# Patient Record
Sex: Female | Born: 1965 | Race: White | Hispanic: No | Marital: Married | Smoking: Never smoker
Health system: Southern US, Community
[De-identification: ages and names within clinical notes are randomized; demographics above are authoritative.]

---

## 2005-03-31 ENCOUNTER — Encounter: Admission: RE | Admit: 2005-03-31 | Discharge: 2005-03-31 | Payer: Self-pay | Admitting: Obstetrics and Gynecology

## 2010-06-19 ENCOUNTER — Other Ambulatory Visit: Payer: Self-pay | Admitting: Family Medicine

## 2010-06-19 DIAGNOSIS — Z1231 Encounter for screening mammogram for malignant neoplasm of breast: Secondary | ICD-10-CM

## 2010-07-05 ENCOUNTER — Ambulatory Visit: Payer: Self-pay

## 2010-07-08 ENCOUNTER — Ambulatory Visit
Admission: RE | Admit: 2010-07-08 | Discharge: 2010-07-08 | Disposition: A | Discharge status: Home / Self Care | Payer: Commercial Indemnity | Source: Ambulatory Visit | Attending: Family Medicine | Admitting: Family Medicine

## 2010-07-08 DIAGNOSIS — Z1231 Encounter for screening mammogram for malignant neoplasm of breast: Secondary | ICD-10-CM

## 2011-12-22 ENCOUNTER — Other Ambulatory Visit: Payer: Self-pay | Admitting: Family Medicine

## 2011-12-22 DIAGNOSIS — Z1231 Encounter for screening mammogram for malignant neoplasm of breast: Secondary | ICD-10-CM

## 2012-01-29 ENCOUNTER — Ambulatory Visit
Admission: RE | Admit: 2012-01-29 | Discharge: 2012-01-29 | Disposition: A | Discharge status: Home / Self Care | Payer: Managed Care, Other (non HMO) | Source: Ambulatory Visit | Attending: Family Medicine | Admitting: Family Medicine

## 2012-01-29 DIAGNOSIS — Z1231 Encounter for screening mammogram for malignant neoplasm of breast: Secondary | ICD-10-CM

## 2013-01-11 ENCOUNTER — Other Ambulatory Visit: Payer: Self-pay

## 2013-01-11 DIAGNOSIS — Z1231 Encounter for screening mammogram for malignant neoplasm of breast: Secondary | ICD-10-CM

## 2013-02-11 ENCOUNTER — Ambulatory Visit
Admission: RE | Admit: 2013-02-11 | Discharge: 2013-02-11 | Disposition: A | Discharge status: Home / Self Care | Payer: Managed Care, Other (non HMO) | Source: Ambulatory Visit

## 2013-02-11 DIAGNOSIS — Z1231 Encounter for screening mammogram for malignant neoplasm of breast: Secondary | ICD-10-CM

## 2013-03-19 ENCOUNTER — Emergency Department (HOSPITAL_BASED_OUTPATIENT_CLINIC_OR_DEPARTMENT_OTHER)
Admission: EM | Admit: 2013-03-19 | Discharge: 2013-03-19 | Disposition: A | Discharge status: Home / Self Care | Payer: Managed Care, Other (non HMO) | Attending: Emergency Medicine | Admitting: Emergency Medicine

## 2013-03-19 ENCOUNTER — Encounter (HOSPITAL_BASED_OUTPATIENT_CLINIC_OR_DEPARTMENT_OTHER): Payer: Self-pay | Admitting: Emergency Medicine

## 2013-03-19 ENCOUNTER — Emergency Department (HOSPITAL_BASED_OUTPATIENT_CLINIC_OR_DEPARTMENT_OTHER): Payer: Managed Care, Other (non HMO)

## 2013-03-19 DIAGNOSIS — W010XXA Fall on same level from slipping, tripping and stumbling without subsequent striking against object, initial encounter: Secondary | ICD-10-CM | POA: Insufficient documentation

## 2013-03-19 DIAGNOSIS — S60219A Contusion of unspecified wrist, initial encounter: Secondary | ICD-10-CM | POA: Insufficient documentation

## 2013-03-19 DIAGNOSIS — Y9373 Activity, racquet and hand sports: Secondary | ICD-10-CM | POA: Insufficient documentation

## 2013-03-19 DIAGNOSIS — Y9239 Other specified sports and athletic area as the place of occurrence of the external cause: Secondary | ICD-10-CM | POA: Insufficient documentation

## 2013-03-19 DIAGNOSIS — Y92838 Other recreation area as the place of occurrence of the external cause: Secondary | ICD-10-CM

## 2013-03-19 DIAGNOSIS — S63509A Unspecified sprain of unspecified wrist, initial encounter: Secondary | ICD-10-CM

## 2013-03-19 MED ORDER — IBUPROFEN 800 MG PO TABS
800.0000 mg | ORAL_TABLET | Freq: Once | ORAL | Status: AC
  Administered 2013-03-19: 800 mg via ORAL
  Filled 2013-03-19: qty 1

## 2013-03-19 NOTE — ED Notes (Signed)
Patient transported to X-ray 

## 2013-03-19 NOTE — Discharge Instructions (Signed)
Wrist Pain Wrist injuries are frequent in adults and children. A sprain is an injury to the ligaments that hold your bones together. A strain is an injury to muscle or muscle cord-like structures (tendons) from stretching or pulling. Generally, when wrists are moderately tender to touch following a fall or injury, a break in the bone (fracture) may be present. Most wrist sprains or strains are better in 3 to 5 days, but complete healing may take several weeks. HOME CARE INSTRUCTIONS   Put ice on the injured area.  Put ice in a plastic bag.  Place a towel between your skin and the bag.  Leave the ice on for 15-20 minutes, 03-04 times a day, for the first 2 days.  Keep your arm raised above the level of your heart whenever possible to reduce swelling and pain.  Rest the injured area for at least 48 hours or as directed by your caregiver.  If a splint or elastic bandage has been applied, use it for as long as directed by your caregiver or until seen by a caregiver for a follow-up exam.  Only take over-the-counter or prescription medicines for pain, discomfort, or fever as directed by your caregiver.  Keep all follow-up appointments. You may need to follow up with a specialist or have follow-up X-rays. Improvement in pain level is not a guarantee that you did not fracture a bone in your wrist. The only way to determine whether or not you have a broken bone is by X-ray. SEEK IMMEDIATE MEDICAL CARE IF:   Your fingers are swollen, very red, white, or cold and blue.  Your fingers are numb or tingling.  You have increasing pain.  You have difficulty moving your fingers. MAKE SURE YOU:   Understand these instructions.  Will watch your condition.  Will get help right away if you are not doing well or get worse. Document Released: 10/23/2004 Document Revised: 04/07/2011 Document Reviewed: 03/06/2010 [employer] Patient Information 2014 [employer].  

## 2013-03-19 NOTE — ED Provider Notes (Signed)
CSN: [id number]     Arrival date & time 03/19/13  2033 History  This chart was scribed for Susan BuccoMelanie Courtenay Hirth, MD by Dorothey Basemania Sutton, ED Scribe. This patient was seen in room MH01/MH01 and the patient's care was started at 9:38 PM.    Chief Complaint  Patient presents with  . Fall   The history is provided by the patient. No language interpreter was used.   HPI Comments: Susan Haynes is a 48 y.o. female who presents to the Emergency Department complaining of a fall that occurred about 8.5 hours ago when she reports that she tripped while playing tennis and landed on her left arm (FOOSH mechanism). Patient is complaining of a constant pain with associated swelling and ecchymosis to the left wrist that has been progressively worsening since the incident. She denies taking any medications at home to treat her symptoms. She denies numbness to the area or any other injuries. Patient is right-handed. She states her last PO intake was about 3.5 hours ago. Patient has no other pertinent medical history.   History reviewed. No pertinent past medical history. History reviewed. No pertinent past surgical history. No family history on file. History  Substance Use Topics  . Smoking status: Never Smoker   . Smokeless tobacco: Not on file  . Alcohol Use: Yes   OB History   Grav Para Term Preterm Abortions TAB SAB Ect Mult Living                 Review of Systems  Constitutional: Negative for fever.  Gastrointestinal: Negative for nausea and vomiting.  Musculoskeletal: Positive for arthralgias (left wrist) and joint swelling. Negative for back pain and neck pain.  Skin: Positive for color change (ecchymosis). Negative for wound.  Neurological: Negative for weakness, numbness and headaches.   Allergies  Review of patient's allergies indicates no known allergies.  Home Medications  No current outpatient prescriptions on file.  Triage Vitals: BP 107/70  Pulse 82  Temp(Src) 98.6 F (37 C) (Oral)   Resp 16  Ht 5\' 3"  (1.6 m)  Wt 154 lb (69.854 kg)  BMI 27.29 kg/m2  SpO2 100%  Physical Exam  Constitutional: She is oriented to person, place, and time. She appears well-developed and well-nourished.  HENT:  Head: Normocephalic and atraumatic.  Eyes: Pupils are equal, round, and reactive to light.  Neck: Normal range of motion. Neck supple.  Cardiovascular: Intact distal pulses.   Pulmonary/Chest: Effort normal. No respiratory distress.  Abdominal: Soft.  Musculoskeletal: Normal range of motion. She exhibits no edema.  No tenderness to the elbow or hand.   Tenderness on the radial aspect of the left wrist with ecchymosis and swelling. No obvious deformity. No wounds. Normal sensation and motor function.   Lymphadenopathy:    She has no cervical adenopathy.  Neurological: She is alert and oriented to person, place, and time.  Skin: Skin is warm and dry. No rash noted.  Psychiatric: She has a normal mood and affect.    ED Course  Procedures (including critical care time)  DIAGNOSTIC STUDIES: Oxygen Saturation is 100% on room air, normal by my interpretation.    COORDINATION OF CARE: 9:40 PM- Will order an x-ray of the left wrist. Discussed treatment plan with patient at bedside and patient verbalized agreement.     Labs Review Labs Reviewed - No data to display  Imaging Review Dg Wrist Complete Left  03/19/2013   CLINICAL DATA:  Fall, left wrist pain and swelling.  EXAM: LEFT  WRIST - COMPLETE 3+ VIEW  COMPARISON:  None available for comparison at time of study interpretation.  FINDINGS: No acute fracture deformity or dislocation. Joint space intact without erosions. No destructive bony lesions. Mild dorsal wrist soft tissue swelling without subcutaneous gas or radiopaque foreign bodies.  IMPRESSION: Mild soft tissue swelling without fracture deformity nor dislocation.   Electronically Signed   By: Awilda Metro   On: 03/19/2013 22:24    EKG Interpretation   None        MDM   Final diagnoses:  Wrist sprain    No fractures identified. Patient does not have specific tenderness of the scaphoid. However given her amount tenderness I did go ahead and put her in a thumb spica splint. She was advised in ice and elevation. She was encouraged to use ibuprofen for the pain and swelling. I advised her to leave the wrist in the splint for the next few days if it's still significantly tender after that she needs a followup for reevaluation with Dr. Pearletha Forge.  I personally performed the services described in this documentation, which was scribed in my presence.  The recorded information has been reviewed and considered.     Susan Bucco, MD 03/19/13 2241

## 2013-03-19 NOTE — ED Notes (Signed)
Playing tennis today, fell, and caught herself with her left arm.  C/o left wrist pain, swelling.  Denies striking head or LOC.

## 2013-03-19 NOTE — ED Notes (Signed)
Pt states she was playing tennis and she fell, landing on her left wrist. Pt has swelling noted to the left wrist. Pt has wrist elevated and pain decreased with elevation.

## 2014-02-03 ENCOUNTER — Other Ambulatory Visit: Payer: Self-pay

## 2014-02-03 DIAGNOSIS — Z1231 Encounter for screening mammogram for malignant neoplasm of breast: Secondary | ICD-10-CM

## 2014-02-21 ENCOUNTER — Ambulatory Visit: Payer: Managed Care, Other (non HMO)

## 2014-03-21 ENCOUNTER — Encounter (INDEPENDENT_AMBULATORY_CARE_PROVIDER_SITE_OTHER): Payer: Self-pay

## 2014-03-21 ENCOUNTER — Ambulatory Visit
Admission: RE | Admit: 2014-03-21 | Discharge: 2014-03-21 | Disposition: A | Discharge status: Home / Self Care | Payer: Managed Care, Other (non HMO) | Source: Ambulatory Visit

## 2014-03-21 DIAGNOSIS — Z1231 Encounter for screening mammogram for malignant neoplasm of breast: Secondary | ICD-10-CM

## 2015-02-20 ENCOUNTER — Other Ambulatory Visit: Payer: Self-pay

## 2015-02-20 DIAGNOSIS — Z1231 Encounter for screening mammogram for malignant neoplasm of breast: Secondary | ICD-10-CM

## 2015-03-26 ENCOUNTER — Ambulatory Visit
Admission: RE | Admit: 2015-03-26 | Discharge: 2015-03-26 | Disposition: A | Discharge status: Home / Self Care | Payer: Managed Care, Other (non HMO) | Source: Ambulatory Visit

## 2015-03-26 DIAGNOSIS — Z1231 Encounter for screening mammogram for malignant neoplasm of breast: Secondary | ICD-10-CM

## 2015-03-29 ENCOUNTER — Other Ambulatory Visit: Payer: Self-pay | Admitting: Family Medicine

## 2015-03-29 DIAGNOSIS — S82892A Other fracture of left lower leg, initial encounter for closed fracture: Secondary | ICD-10-CM

## 2015-03-30 ENCOUNTER — Other Ambulatory Visit: Payer: Self-pay | Admitting: Family Medicine

## 2015-03-30 DIAGNOSIS — N644 Mastodynia: Secondary | ICD-10-CM

## 2015-04-03 ENCOUNTER — Ambulatory Visit
Admission: RE | Admit: 2015-04-03 | Discharge: 2015-04-03 | Disposition: A | Discharge status: Home / Self Care | Payer: Managed Care, Other (non HMO) | Source: Ambulatory Visit | Attending: Family Medicine | Admitting: Family Medicine

## 2015-04-03 DIAGNOSIS — N644 Mastodynia: Secondary | ICD-10-CM

## 2015-05-23 ENCOUNTER — Other Ambulatory Visit: Payer: Managed Care, Other (non HMO)

## 2015-06-08 ENCOUNTER — Other Ambulatory Visit: Payer: Managed Care, Other (non HMO)

## 2016-05-08 ENCOUNTER — Other Ambulatory Visit: Payer: Self-pay | Admitting: Family Medicine

## 2016-05-08 DIAGNOSIS — Z1231 Encounter for screening mammogram for malignant neoplasm of breast: Secondary | ICD-10-CM

## 2016-05-28 ENCOUNTER — Ambulatory Visit
Admission: RE | Admit: 2016-05-28 | Discharge: 2016-05-28 | Disposition: A | Discharge status: Home / Self Care | Payer: Managed Care, Other (non HMO) | Source: Ambulatory Visit | Attending: Family Medicine | Admitting: Family Medicine

## 2016-05-28 DIAGNOSIS — Z1231 Encounter for screening mammogram for malignant neoplasm of breast: Secondary | ICD-10-CM

## 2017-05-28 ENCOUNTER — Other Ambulatory Visit: Payer: Self-pay | Admitting: Family Medicine

## 2017-05-28 DIAGNOSIS — Z1231 Encounter for screening mammogram for malignant neoplasm of breast: Secondary | ICD-10-CM

## 2017-06-24 ENCOUNTER — Ambulatory Visit: Payer: Managed Care, Other (non HMO)

## 2017-09-24 ENCOUNTER — Ambulatory Visit
Admission: RE | Admit: 2017-09-24 | Discharge: 2017-09-24 | Disposition: A | Discharge status: Home / Self Care | Payer: Managed Care, Other (non HMO) | Source: Ambulatory Visit | Attending: Family Medicine | Admitting: Family Medicine

## 2017-09-24 DIAGNOSIS — Z1231 Encounter for screening mammogram for malignant neoplasm of breast: Secondary | ICD-10-CM

## 2017-12-04 ENCOUNTER — Other Ambulatory Visit: Payer: Self-pay | Admitting: Family Medicine

## 2017-12-04 ENCOUNTER — Ambulatory Visit
Admission: RE | Admit: 2017-12-04 | Discharge: 2017-12-04 | Disposition: A | Discharge status: Home / Self Care | Payer: Managed Care, Other (non HMO) | Source: Ambulatory Visit | Attending: Family Medicine | Admitting: Family Medicine

## 2017-12-04 DIAGNOSIS — R52 Pain, unspecified: Secondary | ICD-10-CM

## 2018-12-02 ENCOUNTER — Other Ambulatory Visit: Payer: Self-pay | Admitting: Family Medicine

## 2018-12-02 DIAGNOSIS — Z1231 Encounter for screening mammogram for malignant neoplasm of breast: Secondary | ICD-10-CM

## 2019-02-01 ENCOUNTER — Ambulatory Visit
Admission: RE | Admit: 2019-02-01 | Discharge: 2019-02-01 | Disposition: A | Discharge status: Home / Self Care | Payer: Managed Care, Other (non HMO) | Source: Ambulatory Visit | Attending: Family Medicine | Admitting: Family Medicine

## 2019-02-01 ENCOUNTER — Other Ambulatory Visit: Payer: Self-pay

## 2019-02-01 DIAGNOSIS — Z1231 Encounter for screening mammogram for malignant neoplasm of breast: Secondary | ICD-10-CM

## 2019-08-11 ENCOUNTER — Other Ambulatory Visit: Payer: Self-pay | Admitting: Family Medicine

## 2019-08-11 DIAGNOSIS — M7660 Achilles tendinitis, unspecified leg: Secondary | ICD-10-CM

## 2019-08-12 ENCOUNTER — Other Ambulatory Visit: Payer: Self-pay

## 2019-08-12 ENCOUNTER — Ambulatory Visit
Admission: RE | Admit: 2019-08-12 | Discharge: 2019-08-12 | Disposition: A | Discharge status: Home / Self Care | Payer: Managed Care, Other (non HMO) | Source: Ambulatory Visit | Attending: Family Medicine | Admitting: Family Medicine

## 2019-08-12 DIAGNOSIS — M7660 Achilles tendinitis, unspecified leg: Secondary | ICD-10-CM

## 2021-06-12 IMAGING — MG DIGITAL SCREENING BILAT W/ TOMO W/ CAD
8 series · 8 of 24 positions shown · non-contrast
Comparison: Previous exam(s).

CLINICAL DATA: Screening.

EXAM:
DIGITAL SCREENING BILATERAL MAMMOGRAM WITH TOMO AND CAD

[R CC synth-2D]
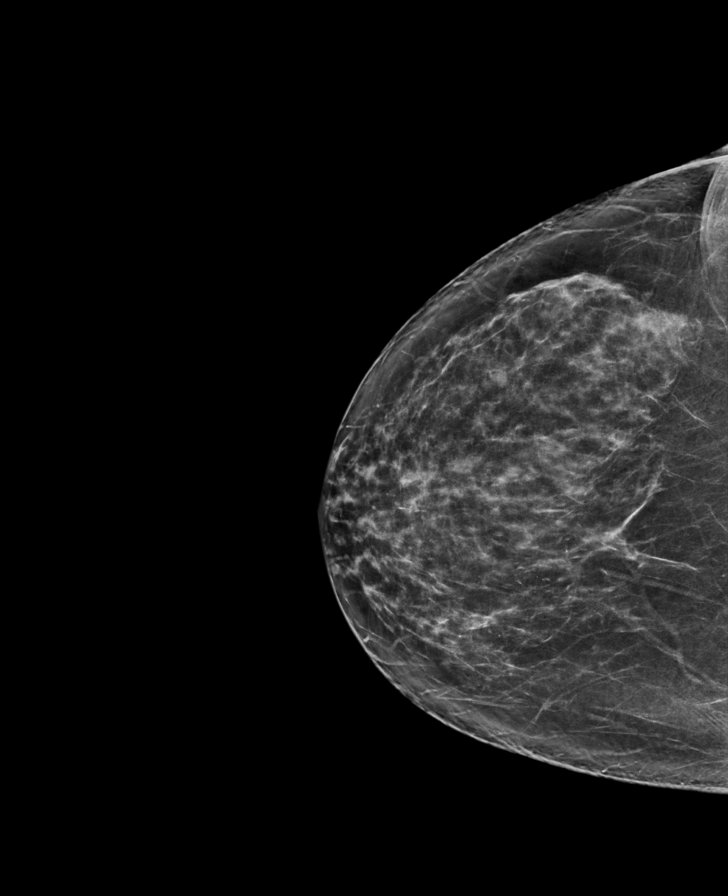

[L CC synth-2D]
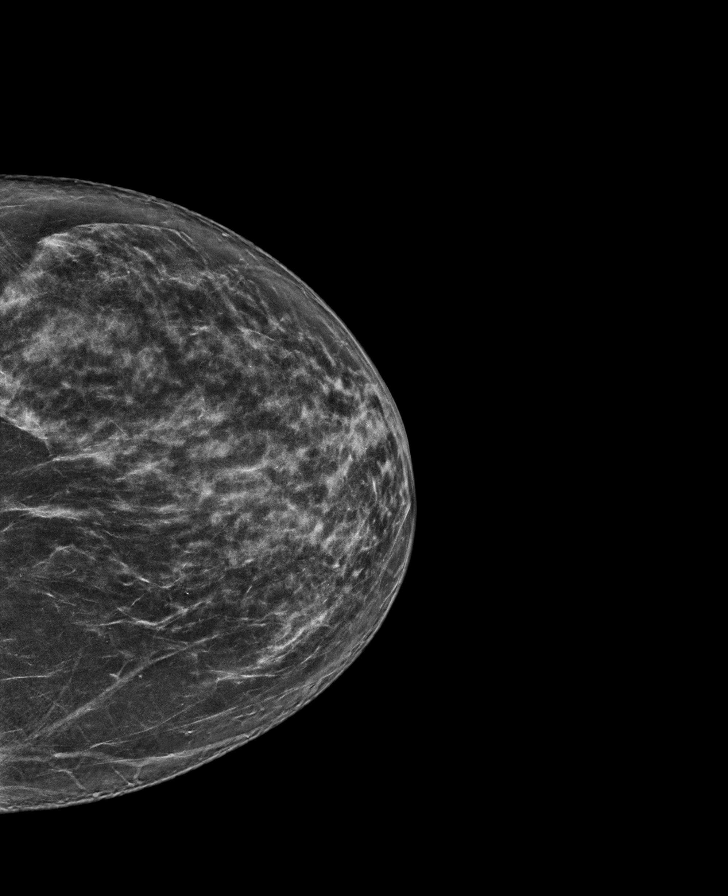

[L MLO synth-2D]
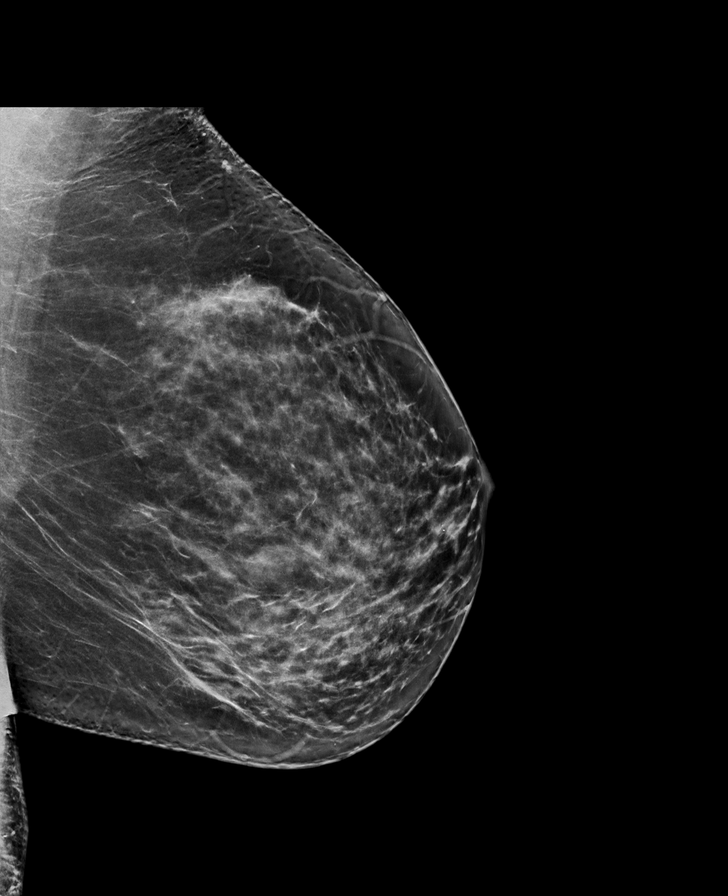

[R MLO synth-2D]
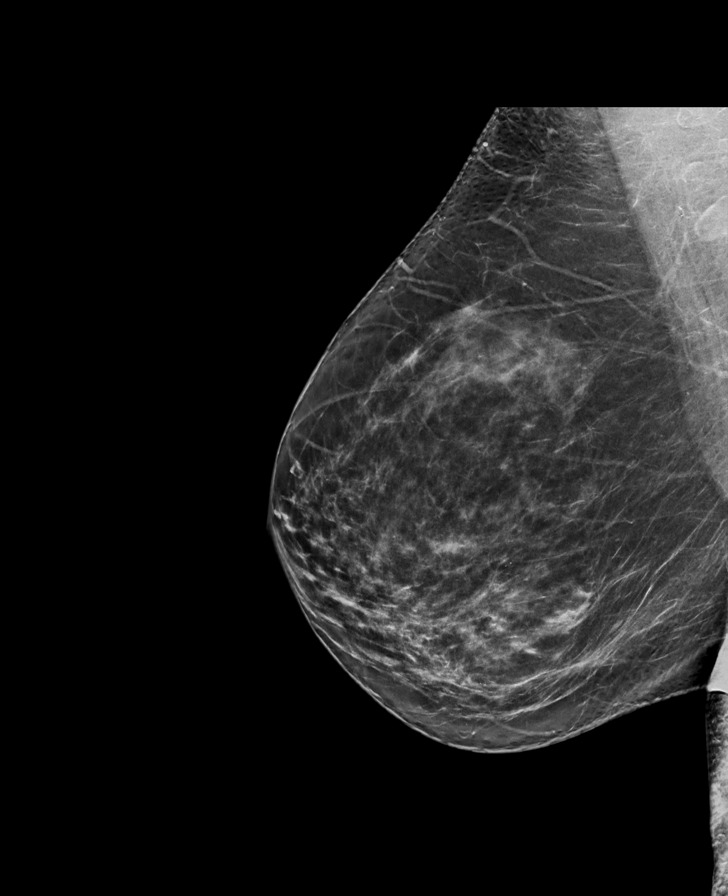

[R CC tomo · tomo slice 41/80.0]
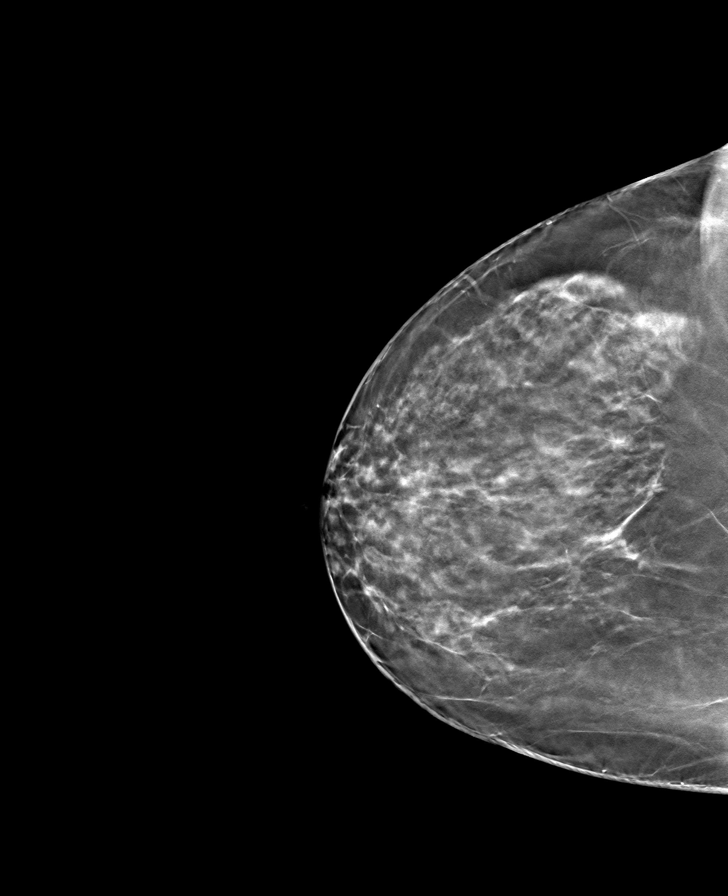

[L MLO tomo · tomo slice 41/82.0]
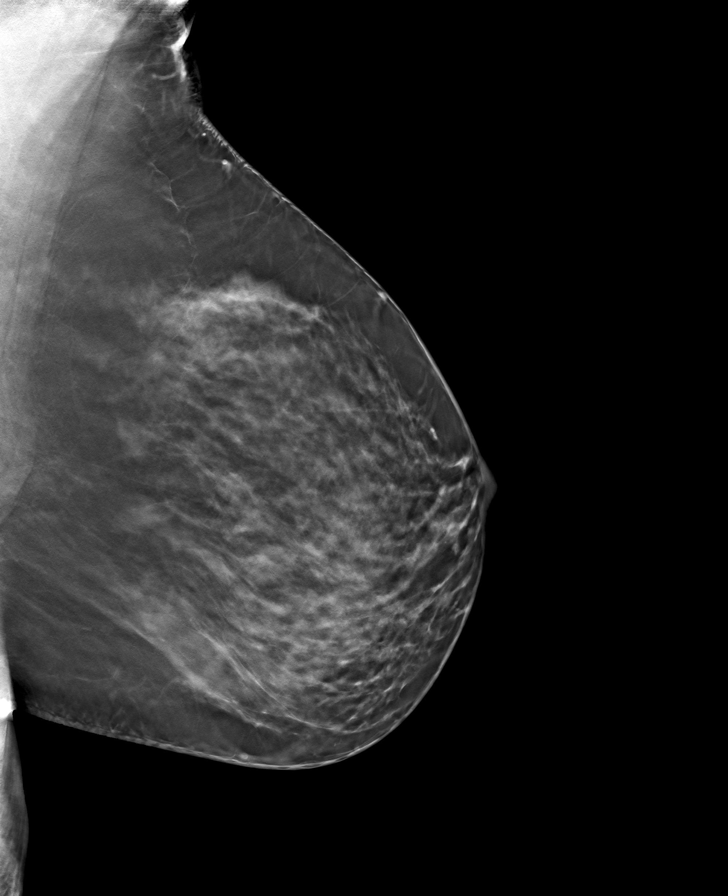

[L CC tomo · tomo slice 39/78.0]
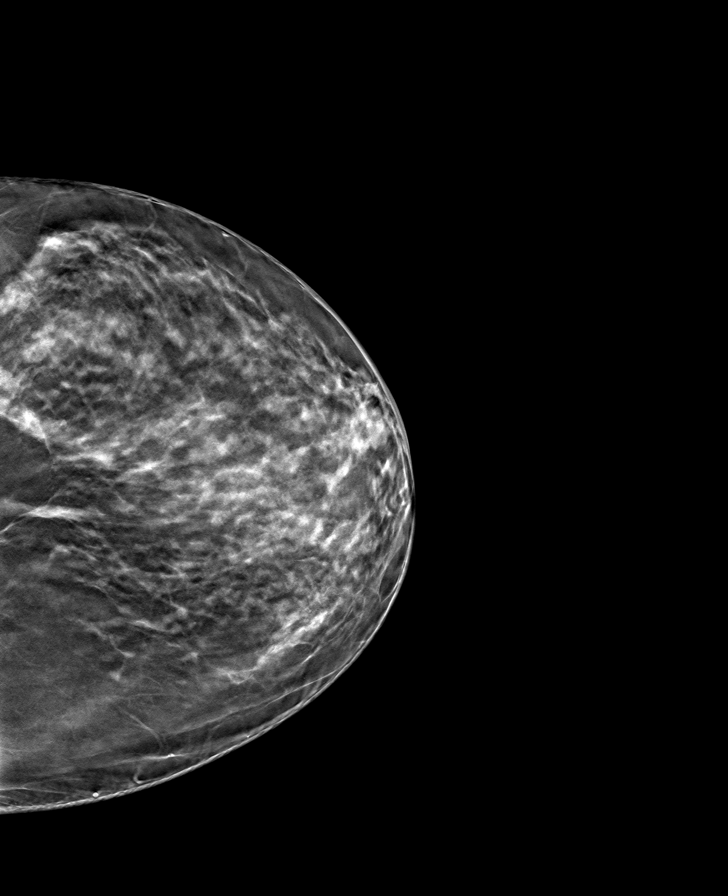

[R MLO tomo · tomo slice 41/80.0]
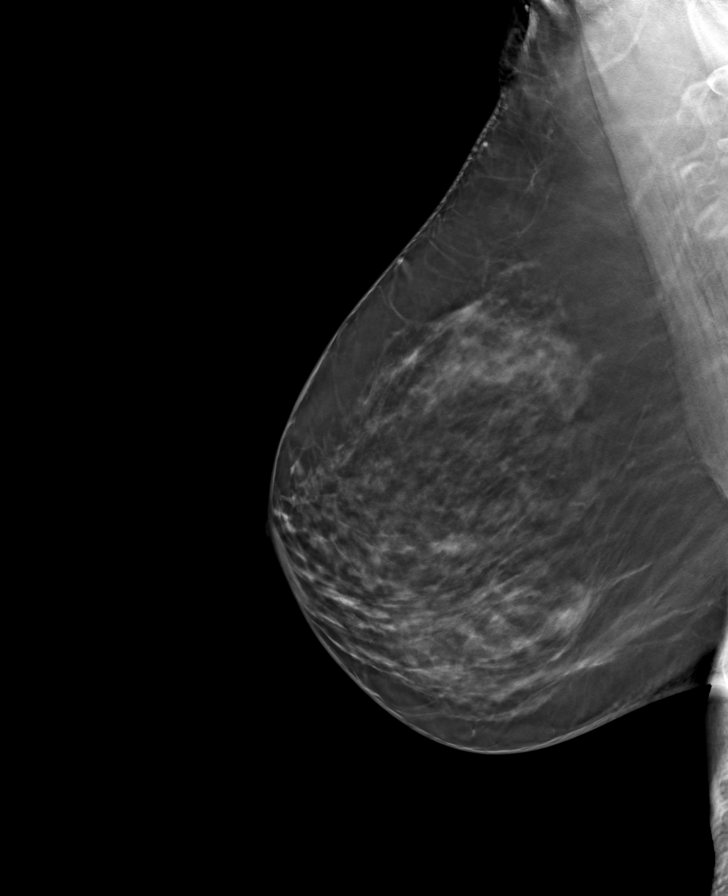

[8 of 24 positions shown; findings below may reference images not displayed]

ACR Breast Density Category c: The breast tissue is heterogeneously
dense, which may obscure small masses.
FINDINGS: There are no findings suspicious for malignancy. Images were
processed with CAD.
IMPRESSION: No mammographic evidence of malignancy. A result letter of this
screening mammogram will be mailed directly to the patient.

RECOMMENDATION:
Screening mammogram in one year. (Code:FT-[id number])

BI-RADS CATEGORY  1: Negative.

## 2021-08-07 ENCOUNTER — Other Ambulatory Visit: Payer: Self-pay | Admitting: Family Medicine

## 2021-08-07 DIAGNOSIS — Z1231 Encounter for screening mammogram for malignant neoplasm of breast: Secondary | ICD-10-CM

## 2021-10-03 ENCOUNTER — Ambulatory Visit: Payer: Managed Care, Other (non HMO)

## 2021-10-07 ENCOUNTER — Ambulatory Visit: Payer: Managed Care, Other (non HMO)

## 2021-10-09 ENCOUNTER — Ambulatory Visit
Admission: RE | Admit: 2021-10-09 | Discharge: 2021-10-09 | Disposition: A | Discharge status: Home / Self Care | Payer: 59 | Source: Ambulatory Visit | Attending: Family Medicine | Admitting: Family Medicine

## 2021-10-09 DIAGNOSIS — Z1231 Encounter for screening mammogram for malignant neoplasm of breast: Secondary | ICD-10-CM

## 2021-10-11 ENCOUNTER — Other Ambulatory Visit: Payer: Self-pay | Admitting: Family Medicine

## 2021-10-11 DIAGNOSIS — R928 Other abnormal and inconclusive findings on diagnostic imaging of breast: Secondary | ICD-10-CM

## 2021-11-05 ENCOUNTER — Ambulatory Visit
Admission: RE | Admit: 2021-11-05 | Discharge: 2021-11-05 | Disposition: A | Discharge status: Home / Self Care | Payer: 59 | Source: Ambulatory Visit | Attending: Family Medicine | Admitting: Family Medicine

## 2021-11-05 ENCOUNTER — Other Ambulatory Visit: Payer: Self-pay | Admitting: Family Medicine

## 2021-11-05 DIAGNOSIS — R928 Other abnormal and inconclusive findings on diagnostic imaging of breast: Secondary | ICD-10-CM

## 2021-11-05 DIAGNOSIS — N6489 Other specified disorders of breast: Secondary | ICD-10-CM

## 2021-11-25 ENCOUNTER — Ambulatory Visit
Admission: RE | Admit: 2021-11-25 | Discharge: 2021-11-25 | Disposition: A | Discharge status: Home / Self Care | Payer: 59 | Source: Ambulatory Visit | Attending: Family Medicine | Admitting: Family Medicine

## 2021-11-25 DIAGNOSIS — N6489 Other specified disorders of breast: Secondary | ICD-10-CM

## 2022-06-19 ENCOUNTER — Other Ambulatory Visit: Payer: Self-pay | Admitting: Family Medicine

## 2022-06-19 DIAGNOSIS — N6489 Other specified disorders of breast: Secondary | ICD-10-CM

## 2022-07-28 ENCOUNTER — Ambulatory Visit: Admission: RE | Admit: 2022-07-28 | Payer: 59 | Source: Ambulatory Visit

## 2022-07-28 ENCOUNTER — Ambulatory Visit
Admission: RE | Admit: 2022-07-28 | Discharge: 2022-07-28 | Disposition: A | Discharge status: Home / Self Care | Payer: 59 | Source: Ambulatory Visit | Attending: Family Medicine | Admitting: Family Medicine

## 2022-07-28 DIAGNOSIS — N6489 Other specified disorders of breast: Secondary | ICD-10-CM

## 2022-08-11 ENCOUNTER — Other Ambulatory Visit: Payer: Self-pay | Admitting: Family Medicine

## 2022-08-11 DIAGNOSIS — R928 Other abnormal and inconclusive findings on diagnostic imaging of breast: Secondary | ICD-10-CM

## 2023-02-13 ENCOUNTER — Ambulatory Visit
Admission: RE | Admit: 2023-02-13 | Discharge: 2023-02-13 | Disposition: A | Discharge status: Home / Self Care | Payer: 59 | Source: Ambulatory Visit | Attending: Family Medicine | Admitting: Family Medicine

## 2023-02-13 ENCOUNTER — Other Ambulatory Visit: Payer: Self-pay | Admitting: Family Medicine

## 2023-02-13 ENCOUNTER — Encounter: Payer: Self-pay | Admitting: Family Medicine

## 2023-02-13 DIAGNOSIS — R928 Other abnormal and inconclusive findings on diagnostic imaging of breast: Secondary | ICD-10-CM

## 2023-02-13 DIAGNOSIS — N6489 Other specified disorders of breast: Secondary | ICD-10-CM

## 2024-03-02 ENCOUNTER — Other Ambulatory Visit: Payer: Self-pay | Admitting: Family Medicine

## 2024-03-02 DIAGNOSIS — Z9189 Other specified personal risk factors, not elsewhere classified: Secondary | ICD-10-CM

## 2024-03-02 DIAGNOSIS — I2510 Atherosclerotic heart disease of native coronary artery without angina pectoris: Secondary | ICD-10-CM

## 2024-03-02 NOTE — Progress Notes (Signed)
 At risk of CAD given fmhx and personal h/o DM and HLD. CAC ordered.  Fmhx unclear and concern for osteoporosis. 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 Recently improved but early DEXA scan warranted.

## 2024-03-30 ENCOUNTER — Ambulatory Visit (HOSPITAL_BASED_OUTPATIENT_CLINIC_OR_DEPARTMENT_OTHER)
